# Patient Record
Sex: Female | Born: 1995 | Race: White | Hispanic: No
Health system: Western US, Academic
[De-identification: ages and names within clinical notes are randomized; demographics above are authoritative.]

## PROBLEM LIST (undated history)

## (undated) DIAGNOSIS — Z87820 Personal history of traumatic brain injury: Secondary | ICD-10-CM

## (undated) DIAGNOSIS — E109 Type 1 diabetes mellitus without complications: Secondary | ICD-10-CM

## (undated) DIAGNOSIS — J3489 Other specified disorders of nose and nasal sinuses: Secondary | ICD-10-CM

## (undated) DIAGNOSIS — Z9641 Presence of insulin pump (external) (internal): Secondary | ICD-10-CM

## (undated) DIAGNOSIS — T8859XA Other complications of anesthesia, initial encounter: Secondary | ICD-10-CM

## (undated) DIAGNOSIS — T4145XA Adverse effect of unspecified anesthetic, initial encounter: Secondary | ICD-10-CM

## (undated) HISTORY — DX: Type 1 diabetes mellitus without complications: E10.9

## (undated) HISTORY — PX: WISDOM TOOTH EXTRACTION: SHX21

---
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
## 2016-01-12 DIAGNOSIS — E1065 Type 1 diabetes mellitus with hyperglycemia: Secondary | ICD-10-CM | POA: Diagnosis not present

## 2016-02-03 DIAGNOSIS — Z23 Encounter for immunization: Secondary | ICD-10-CM | POA: Diagnosis not present

## 2016-02-16 DIAGNOSIS — E1065 Type 1 diabetes mellitus with hyperglycemia: Secondary | ICD-10-CM | POA: Diagnosis not present

## 2016-03-01 DIAGNOSIS — E1065 Type 1 diabetes mellitus with hyperglycemia: Secondary | ICD-10-CM | POA: Diagnosis not present

## 2016-03-08 DIAGNOSIS — R220 Localized swelling, mass and lump, head: Secondary | ICD-10-CM | POA: Diagnosis not present

## 2016-03-17 DIAGNOSIS — J3489 Other specified disorders of nose and nasal sinuses: Secondary | ICD-10-CM

## 2016-03-17 HISTORY — DX: Other specified disorders of nose and nasal sinuses: J34.89

## 2016-03-22 NOTE — H&P (Signed)
Subjective:     Patient ID: Danielle Chapman is a 20 y.o. female.  HPI  Returns to office for evaluation mass left nostril present for 4-5 years. Reports had cold during time of development. Notes increased in size when first appeared, now stable. No history infection, drainage. No prior interventions with exception cream given by dermatology. Last seen here 10.2016 by Dr. Marla Roe that recommended surgery referral for evaluation pilonidal- patient and mother report was seen by General Surgeon who recommended no surgical intervention, antibiotics as needed.    PMH significant for DM 1, managed by Dr. Domenica Fail Endocrinology at [facility]. Has insulin pump. Reports last HbA1c 11.0, but this was following prolonged back packing trip and plan was to leave BS at higher range. Notes had wisdom teeth out in office under sedation, woke up very upset which she does not remember. Also mother reports surgeon allowed her to eat toast in am, given her BS.   Review of Systems As HPI, remainder 12 point review negative.    Objective:   Physical Exam  Constitutional: She is oriented to person, place, and time.  Cardiovascular: Normal rate, regular rhythm and normal heart sounds.   Pulmonary/Chest: Effort normal and breath sounds normal.  Neurological: She is alert and oriented to person, place, and time.  Skin:  Fitzpatrick 1  HEENT: left nostril sill 5 mm mass, no punctum visisble     Assessment:     Mass left nostril DM 1    Plan:     Soft tissue mass left nostril, has been counseled cyst in past. Reviewed excisional biopsy, this would produce scar longer than lesion itself and some part of scar may be visible/present over upper lip skin. Discussed in office under local vs under sedation. Reports aversion to needles despite DM1; counseled the local will burn with injection and area is a sensitive area. She would prefer under sedation; counseled I would be ok with IV sedation but ultimately this is the  decision of anesthesiologist. Mother expressed concern regarding her DM and NPO status; one of reasons they have not pursued excision in past.   Reviewed scar maturation over months, expected edema and post procedure limitations.   Irene Limbo, MD [facility]astic [facility] Reconstructive Surgery [phone], pin [phone]

## 2016-03-30 ENCOUNTER — Encounter (HOSPITAL_BASED_OUTPATIENT_CLINIC_OR_DEPARTMENT_OTHER): Payer: Self-pay | Admitting: *Deleted

## 2016-03-30 NOTE — Pre-Procedure Instructions (Signed)
To come for BMET, EKG and to pick up 8 oz. water for ERAS 

## 2016-03-31 ENCOUNTER — Encounter (HOSPITAL_BASED_OUTPATIENT_CLINIC_OR_DEPARTMENT_OTHER)
Admission: RE | Admit: 2016-03-31 | Discharge: 2016-03-31 | Disposition: A | Discharge status: Home / Self Care | Payer: BLUE CROSS/BLUE SHIELD | Source: Ambulatory Visit | Attending: Plastic Surgery | Admitting: Plastic Surgery

## 2016-03-31 DIAGNOSIS — D1809 Hemangioma of other sites: Secondary | ICD-10-CM | POA: Diagnosis not present

## 2016-03-31 DIAGNOSIS — E109 Type 1 diabetes mellitus without complications: Secondary | ICD-10-CM | POA: Diagnosis not present

## 2016-03-31 DIAGNOSIS — R220 Localized swelling, mass and lump, head: Secondary | ICD-10-CM | POA: Diagnosis not present

## 2016-03-31 LAB — BASIC METABOLIC PANEL
Anion gap: 8 (ref 5–15)
BUN: 17 mg/dL (ref 6–20)
CALCIUM: 9 mg/dL (ref 8.9–10.3)
CO2: 25 mmol/L (ref 22–32)
CREATININE: 0.64 mg/dL (ref 0.44–1.00)
Chloride: 102 mmol/L (ref 101–111)
GFR calc Af Amer: >60 mL/min (ref >60)
Glucose, Bld: 354 mg/dL — ABNORMAL HIGH (ref 65–99)
Potassium: 4.6 mmol/L (ref 3.5–5.1)
SODIUM: 135 mmol/L (ref 135–145)

## 2016-03-31 NOTE — Progress Notes (Signed)
Left message for ashely's mother results of blood surgar of 354.instructed to return call.  Spoke to Dr Marin Comment no further treatment needed at this time

## 2016-03-31 NOTE — Pre-Procedure Instructions (Signed)
Pt. called back, states her sugar usually does not run that high, but she gets very anxious with needle sticks.  States has adjusted Insulin pump to cover her elevated blood sugar.

## 2016-03-31 NOTE — Progress Notes (Signed)
Pt given 8 oz bottle of water to drink by 4 am morning of surgery teach back , written and verbal ,  Pt voiced understanding

## 2016-04-02 NOTE — Anesthesia Preprocedure Evaluation (Addendum)
Anesthesia Evaluation  Patient identified by MRN, date of birth, ID band Patient awake    Reviewed: Allergy & Precautions, NPO status , Patient's Chart, lab work & pertinent test results  Airway Mallampati: II  TM Distance: >3 FB Neck ROM: Full    Dental  (+) Teeth Intact, Dental Advisory Given   Pulmonary neg pulmonary ROS,    Pulmonary exam normal breath sounds clear to auscultation       Cardiovascular negative cardio ROS Normal cardiovascular exam Rhythm:Regular Rate:Normal     Neuro/Psych negative neurological ROS     GI/Hepatic negative GI ROS, Neg liver ROS,   Endo/Other  diabetes (Insulin pump), Type 1, Insulin Dependent  Renal/GU negative Renal ROS     Musculoskeletal negative musculoskeletal ROS (+)   Abdominal   Peds  Hematology negative hematology ROS (+)   Anesthesia Other Findings Day of surgery medications reviewed with the patient.  Reproductive/Obstetrics negative OB ROS                            Anesthesia Physical Anesthesia Plan  ASA: II  Anesthesia Plan: MAC   Post-op Pain Management:    Induction: Intravenous  Airway Management Planned: Nasal Cannula  Additional Equipment:   Intra-op Plan:   Post-operative Plan:   Informed Consent: I have reviewed the patients History and Physical, chart, labs and discussed the procedure including the risks, benefits and alternatives for the proposed anesthesia with the patient or authorized representative who has indicated his/her understanding and acceptance.   Dental advisory given  Plan Discussed with: CRNA and Anesthesiologist  Anesthesia Plan Comments: (Discussed risks/benefits/alternatives to MAC sedation including need for ventilatory support, hypotension, need for conversion to general anesthesia.  All patient questions answered.  Patient/guardian wishes to proceed.)       Anesthesia Quick Evaluation

## 2016-04-03 ENCOUNTER — Encounter (HOSPITAL_BASED_OUTPATIENT_CLINIC_OR_DEPARTMENT_OTHER): Payer: Self-pay | Admitting: Anesthesiology

## 2016-04-03 ENCOUNTER — Ambulatory Visit (HOSPITAL_BASED_OUTPATIENT_CLINIC_OR_DEPARTMENT_OTHER): Payer: BLUE CROSS/BLUE SHIELD | Admitting: Anesthesiology

## 2016-04-03 ENCOUNTER — Ambulatory Visit (HOSPITAL_BASED_OUTPATIENT_CLINIC_OR_DEPARTMENT_OTHER)
Admission: RE | Admit: 2016-04-03 | Discharge: 2016-04-03 | Disposition: A | Discharge status: Home / Self Care | Payer: BLUE CROSS/BLUE SHIELD | Source: Ambulatory Visit | Attending: Plastic Surgery | Admitting: Plastic Surgery

## 2016-04-03 ENCOUNTER — Encounter (HOSPITAL_BASED_OUTPATIENT_CLINIC_OR_DEPARTMENT_OTHER)
Admission: RE | Disposition: A | Discharge status: Home / Self Care | Payer: Self-pay | Source: Ambulatory Visit | Attending: Plastic Surgery

## 2016-04-03 DIAGNOSIS — E109 Type 1 diabetes mellitus without complications: Secondary | ICD-10-CM | POA: Diagnosis not present

## 2016-04-03 DIAGNOSIS — D1809 Hemangioma of other sites: Secondary | ICD-10-CM | POA: Insufficient documentation

## 2016-04-03 DIAGNOSIS — R220 Localized swelling, mass and lump, head: Secondary | ICD-10-CM | POA: Diagnosis not present

## 2016-04-03 DIAGNOSIS — D1801 Hemangioma of skin and subcutaneous tissue: Secondary | ICD-10-CM | POA: Diagnosis not present

## 2016-04-03 DIAGNOSIS — E1010 Type 1 diabetes mellitus with ketoacidosis without coma: Secondary | ICD-10-CM | POA: Diagnosis not present

## 2016-04-03 DIAGNOSIS — J3489 Other specified disorders of nose and nasal sinuses: Secondary | ICD-10-CM | POA: Diagnosis not present

## 2016-04-03 HISTORY — DX: Personal history of traumatic brain injury: Z87.820

## 2016-04-03 HISTORY — DX: Other complications of anesthesia, initial encounter: T88.59XA

## 2016-04-03 HISTORY — DX: Adverse effect of unspecified anesthetic, initial encounter: T41.45XA

## 2016-04-03 HISTORY — DX: Presence of insulin pump (external) (internal): Z96.41

## 2016-04-03 HISTORY — PX: LESION EXCISION WITH COMPLEX REPAIR: SHX6700

## 2016-04-03 HISTORY — DX: Other specified disorders of nose and nasal sinuses: J34.89

## 2016-04-03 LAB — GLUCOSE, CAPILLARY
GLUCOSE-CAPILLARY: 223 mg/dL — AB (ref 65–99)
GLUCOSE-CAPILLARY: 239 mg/dL — AB (ref 65–99)

## 2016-04-03 SURGERY — LESION EXCISION WITH COMPLEX REPAIR
Anesthesia: Monitor Anesthesia Care | Site: Nose

## 2016-04-03 MED ORDER — BACITRACIN 500 UNIT/GM EX OINT
TOPICAL_OINTMENT | CUTANEOUS | Status: DC | PRN
  Administered 2016-04-03: 1 via TOPICAL

## 2016-04-03 MED ORDER — MIDAZOLAM HCL 2 MG/2ML IJ SOLN
1.0000 mg | INTRAMUSCULAR | Status: DC | PRN
  Administered 2016-04-03: 2 mg via INTRAVENOUS

## 2016-04-03 MED ORDER — BUPIVACAINE-EPINEPHRINE 0.25% -1:200000 IJ SOLN
INTRAMUSCULAR | Status: DC | PRN
  Administered 2016-04-03: 2 mL

## 2016-04-03 MED ORDER — FENTANYL CITRATE (PF) 100 MCG/2ML IJ SOLN
INTRAMUSCULAR | Status: AC
  Filled 2016-04-03: qty 2

## 2016-04-03 MED ORDER — MIDAZOLAM HCL 2 MG/2ML IJ SOLN
INTRAMUSCULAR | Status: AC
  Filled 2016-04-03: qty 2

## 2016-04-03 MED ORDER — FENTANYL CITRATE (PF) 100 MCG/2ML IJ SOLN
50.0000 ug | INTRAMUSCULAR | Status: DC | PRN
  Administered 2016-04-03: 100 ug via INTRAVENOUS

## 2016-04-03 MED ORDER — PROPOFOL 10 MG/ML IV BOLUS
INTRAVENOUS | Status: DC | PRN
  Administered 2016-04-03: 50 mg via INTRAVENOUS

## 2016-04-03 MED ORDER — PROMETHAZINE HCL 25 MG/ML IJ SOLN: 6.2500 mg | INTRAMUSCULAR | Status: DC | PRN

## 2016-04-03 MED ORDER — SCOPOLAMINE 1 MG/3DAYS TD PT72
1.0000 | MEDICATED_PATCH | Freq: Once | TRANSDERMAL | Status: DC | PRN
Start: 2016-04-03 — End: 2016-04-03

## 2016-04-03 MED ORDER — FENTANYL CITRATE (PF) 100 MCG/2ML IJ SOLN: 25.0000 ug | INTRAMUSCULAR | Status: DC | PRN

## 2016-04-03 MED ORDER — CLINDAMYCIN PHOSPHATE 600 MG/50ML IV SOLN
INTRAVENOUS | Status: AC
  Filled 2016-04-03: qty 50

## 2016-04-03 MED ORDER — LACTATED RINGERS IV SOLN
INTRAVENOUS | Status: DC
  Administered 2016-04-03: 07:00:00 via INTRAVENOUS

## 2016-04-03 MED ORDER — BACITRACIN ZINC 500 UNIT/GM EX OINT
TOPICAL_OINTMENT | CUTANEOUS | Status: AC
  Filled 2016-04-03: qty 0.9

## 2016-04-03 MED ORDER — BUPIVACAINE-EPINEPHRINE (PF) 0.25% -1:200000 IJ SOLN
INTRAMUSCULAR | Status: AC
  Filled 2016-04-03: qty 30

## 2016-04-03 MED ORDER — PROPOFOL 500 MG/50ML IV EMUL
INTRAVENOUS | Status: AC
  Filled 2016-04-03: qty 50

## 2016-04-03 MED ORDER — CLINDAMYCIN PHOSPHATE 600 MG/50ML IV SOLN
600.0000 mg | Freq: Once | INTRAVENOUS | Status: AC
Start: 2016-04-03 — End: 2016-04-03
  Administered 2016-04-03: 600 mg via INTRAVENOUS

## 2016-04-03 MED ORDER — LIDOCAINE HCL (PF) 1 % IJ SOLN
INTRAMUSCULAR | Status: AC
  Filled 2016-04-03: qty 30

## 2016-04-03 MED ORDER — BACITRACIN-NEOMYCIN-POLYMYXIN 400-5-5000 EX OINT
TOPICAL_OINTMENT | CUTANEOUS | Status: AC
  Filled 2016-04-03: qty 1

## 2016-04-03 SURGICAL SUPPLY — 49 items
BALL CTTN LRG ABS STRL LF (GAUZE/BANDAGES/DRESSINGS)
BLADE CLIPPER SURG (BLADE) IMPLANT
BLADE SURG 15 STRL LF DISP TIS (BLADE) ×1 IMPLANT
BLADE SURG 15 STRL SS (BLADE) ×3
CLOSURE WOUND 1/2 X4 (GAUZE/BANDAGES/DRESSINGS)
CLOSURE WOUND 1/4X4 (GAUZE/BANDAGES/DRESSINGS)
COTTONBALL LRG STERILE PKG (GAUZE/BANDAGES/DRESSINGS) IMPLANT
COVER BACK TABLE 60X90IN (DRAPES) ×3 IMPLANT
COVER MAYO STAND STRL (DRAPES) ×3 IMPLANT
DECANTER SPIKE VIAL GLASS SM (MISCELLANEOUS) IMPLANT
DRAPE U-SHAPE 76X120 STRL (DRAPES) ×3 IMPLANT
ELECT COATED BLADE 2.86 ST (ELECTRODE) IMPLANT
ELECT NDL BLADE 2-5/6 (NEEDLE) ×1 IMPLANT
ELECT NEEDLE BLADE 2-5/6 (NEEDLE) ×3 IMPLANT
ELECT REM PT RETURN 9FT ADLT (ELECTROSURGICAL) ×3
ELECTRODE REM PT RTRN 9FT ADLT (ELECTROSURGICAL) ×1 IMPLANT
GAUZE XEROFORM 1X8 LF (GAUZE/BANDAGES/DRESSINGS) IMPLANT
GAUZE XEROFORM 5X9 LF (GAUZE/BANDAGES/DRESSINGS) IMPLANT
GLOVE BIO SURGEON STRL SZ 6 (GLOVE) ×3 IMPLANT
GLOVE BIO SURGEON STRL SZ 6.5 (GLOVE) IMPLANT
GLOVE BIO SURGEONS STRL SZ 6.5 (GLOVE)
GLOVE BIOGEL PI IND STRL 7.0 (GLOVE) IMPLANT
GLOVE BIOGEL PI INDICATOR 7.0 (GLOVE) ×2
GLOVE ECLIPSE 6.5 STRL STRAW (GLOVE) ×2 IMPLANT
GOWN STRL REUS W/ TWL LRG LVL3 (GOWN DISPOSABLE) ×2 IMPLANT
GOWN STRL REUS W/TWL LRG LVL3 (GOWN DISPOSABLE) ×6
LIQUID BAND (GAUZE/BANDAGES/DRESSINGS) IMPLANT
NDL PRECISIONGLIDE 27X1.5 (NEEDLE) ×1 IMPLANT
NEEDLE PRECISIONGLIDE 27X1.5 (NEEDLE) ×3 IMPLANT
PACK BASIN DAY SURGERY FS (CUSTOM PROCEDURE TRAY) ×3 IMPLANT
PENCIL BUTTON HOLSTER BLD 10FT (ELECTRODE) ×3 IMPLANT
SHEET MEDIUM DRAPE 40X70 STRL (DRAPES) IMPLANT
SLEEVE SCD COMPRESS KNEE MED (MISCELLANEOUS) IMPLANT
SPONGE GAUZE 2X2 8PLY STER LF (GAUZE/BANDAGES/DRESSINGS)
SPONGE GAUZE 2X2 8PLY STRL LF (GAUZE/BANDAGES/DRESSINGS) IMPLANT
STRIP CLOSURE SKIN 1/2X4 (GAUZE/BANDAGES/DRESSINGS) IMPLANT
STRIP CLOSURE SKIN 1/4X4 (GAUZE/BANDAGES/DRESSINGS) IMPLANT
SUT MNCRL AB 3-0 PS2 18 (SUTURE) ×3 IMPLANT
SUT MNCRL AB 4-0 PS2 18 (SUTURE) ×1 IMPLANT
SUT PLAIN 5 0 P 3 18 (SUTURE) ×2 IMPLANT
SUT PROLENE 5 0 P 3 (SUTURE) IMPLANT
SUT PROLENE 5 0 PS 2 (SUTURE) IMPLANT
SUT PROLENE 6 0 P 1 18 (SUTURE) IMPLANT
SUT VIC AB 3-0 SH 27 (SUTURE)
SUT VIC AB 3-0 SH 27X BRD (SUTURE) ×1 IMPLANT
SYR BULB 3OZ (MISCELLANEOUS) IMPLANT
SYR CONTROL 10ML LL (SYRINGE) ×3 IMPLANT
TOWEL OR 17X24 6PK STRL BLUE (TOWEL DISPOSABLE) ×5 IMPLANT
TRAY DSU PREP LF (CUSTOM PROCEDURE TRAY) ×3 IMPLANT

## 2016-04-03 NOTE — Discharge Instructions (Signed)

## 2016-04-03 NOTE — Transfer of Care (Signed)
Immediate Anesthesia Transfer of Care Note  Patient: Danielle Chapman  Procedure(s) Performed: Procedure(s): LESION EXCISION nose WITH layer close nose 1cm (N/A)  Patient Location: PACU  Anesthesia Type:MAC  Level of Consciousness: awake, alert  and oriented  Airway & Oxygen Therapy: Patient Spontanous Breathing  Post-op Assessment: Report given to RN and Post -op Vital signs reviewed and stable  Post vital signs: Reviewed and stable  Last Vitals:  Vitals:   04/03/16 0647  BP: 104/72  Pulse: 84  Resp: 18  Temp: 36.7 C    Last Pain:  Vitals:   04/03/16 0647  TempSrc: Oral      Patients Stated Pain Goal: 0 ([id number])  Complications: No apparent anesthesia complications

## 2016-04-03 NOTE — Interval H&P Note (Signed)
History and Physical Interval Note:  04/03/2016 7:00 AM  Danielle Chapman  has presented today for surgery, with the diagnosis of lesion nose  The various methods of treatment have been discussed with the patient and family. After consideration of risks, benefits and other options for treatment, the patient has consented to  Procedure(s): LESION EXCISION nose WITH layer close nose 1cm (N/A) as a surgical intervention .  The patient's history has been reviewed, patient examined, no change in status, stable for surgery.  I have reviewed the patient's chart and labs.  Questions were answered to the patient's satisfaction.     [name]

## 2016-04-03 NOTE — Anesthesia Postprocedure Evaluation (Signed)
Anesthesia Post Note  Patient: Danielle Chapman  Procedure(s) Performed: Procedure(s) (LRB): LESION EXCISION nose WITH layer close nose 1cm (N/A)  Patient location during evaluation: PACU Anesthesia Type: MAC Level of consciousness: awake and alert Pain management: pain level controlled Vital Signs Assessment: post-procedure vital signs reviewed and stable Respiratory status: spontaneous breathing, nonlabored ventilation, respiratory function stable and patient connected to nasal cannula oxygen Cardiovascular status: stable and blood pressure returned to baseline Anesthetic complications: no    Last Vitals:  Vitals:   04/03/16 0647 04/03/16 0745  BP: 104/72   Pulse: 84   Resp: 18 20  Temp: 36.7 C 36.6 C    Last Pain:  Vitals:   04/03/16 0647  TempSrc: Oral                 Catalina Gravel

## 2016-04-03 NOTE — Op Note (Signed)
Operative Note   DATE OF OPERATION: 12.18.17  LOCATION: [facility]-outpatient  SURGICAL DIVISION: Plastic Surgery  PREOPERATIVE DIAGNOSES:  1. Left nostril mass  POSTOPERATIVE DIAGNOSES:  same  PROCEDURE:  1. Excision benign mass left nostril 7 mm 2. Layered closure left nostril sill 1 cm  SURGEON: Irene Limbo MD MBA  ASSISTANT: none  ANESTHESIA:  MAC.   EBL: minimal  COMPLICATIONS: None immediate.   INDICATIONS FOR PROCEDURE:  The patient, Danielle Chapman, is a 20 y.o. female born on July 09, 1995, is here for excision soft tissue mass left nostril   FINDINGS: Clinically mass consistent with cyst.  DESCRIPTION OF PROCEDURE:  The patient's operative site was confirmed with the patient in the preoperative area. The patient was taken to the operating room. IV antibiotics were given. Following adequate sedation, the patient's operative site was prepped and draped in a sterile fashion. A time out was performed and all information was confirmed to be correct. Local anesthetic infiltrated to perform left infraorbital nerve block and surrounding mass. Transverse ellipse skin including mass and margins diameter 7 mm excised. Layered closure completed with 5-0 monocryl in dermis and running 5-0 plain gut for skin closure, length 1 cm. Antibiotic ointment applied.   The patient was allowed to wake from anesthesia, extubated and taken to the recovery room in satisfactory condition.   SPECIMENS: left nostril mass  DRAINS: none  Irene Limbo, MD [facility] Plastic & Reconstructive Surgery [phone], pin [phone]

## 2016-04-04 ENCOUNTER — Encounter (HOSPITAL_BASED_OUTPATIENT_CLINIC_OR_DEPARTMENT_OTHER): Payer: Self-pay | Admitting: Plastic Surgery

## 2016-04-05 DIAGNOSIS — Z23 Encounter for immunization: Secondary | ICD-10-CM | POA: Diagnosis not present

## 2016-04-12 DIAGNOSIS — E1065 Type 1 diabetes mellitus with hyperglycemia: Secondary | ICD-10-CM | POA: Diagnosis not present

## 2016-04-12 DIAGNOSIS — J019 Acute sinusitis, unspecified: Secondary | ICD-10-CM | POA: Diagnosis not present

## 2016-04-12 DIAGNOSIS — B9689 Other specified bacterial agents as the cause of diseases classified elsewhere: Secondary | ICD-10-CM | POA: Diagnosis not present

## 2016-04-12 DIAGNOSIS — J40 Bronchitis, not specified as acute or chronic: Secondary | ICD-10-CM | POA: Diagnosis not present

## 2016-05-01 DIAGNOSIS — J029 Acute pharyngitis, unspecified: Secondary | ICD-10-CM | POA: Diagnosis not present

## 2016-05-29 DIAGNOSIS — H109 Unspecified conjunctivitis: Secondary | ICD-10-CM | POA: Diagnosis not present

## 2016-05-29 DIAGNOSIS — J00 Acute nasopharyngitis [common cold]: Secondary | ICD-10-CM | POA: Diagnosis not present

## 2016-06-07 DIAGNOSIS — E1065 Type 1 diabetes mellitus with hyperglycemia: Secondary | ICD-10-CM | POA: Diagnosis not present

## 2016-07-12 DIAGNOSIS — E109 Type 1 diabetes mellitus without complications: Secondary | ICD-10-CM | POA: Diagnosis not present

## 2016-07-12 DIAGNOSIS — H10413 Chronic giant papillary conjunctivitis, bilateral: Secondary | ICD-10-CM | POA: Diagnosis not present

## 2016-07-14 DIAGNOSIS — R1110 Vomiting, unspecified: Secondary | ICD-10-CM | POA: Diagnosis not present

## 2016-07-14 DIAGNOSIS — Y909 Presence of alcohol in blood, level not specified: Secondary | ICD-10-CM | POA: Diagnosis not present

## 2016-07-14 DIAGNOSIS — F10120 Alcohol abuse with intoxication, uncomplicated: Secondary | ICD-10-CM | POA: Diagnosis not present

## 2016-07-19 DIAGNOSIS — E1065 Type 1 diabetes mellitus with hyperglycemia: Secondary | ICD-10-CM | POA: Diagnosis not present

## 2016-07-26 DIAGNOSIS — H10413 Chronic giant papillary conjunctivitis, bilateral: Secondary | ICD-10-CM | POA: Diagnosis not present

## 2016-07-26 DIAGNOSIS — H04123 Dry eye syndrome of bilateral lacrimal glands: Secondary | ICD-10-CM | POA: Diagnosis not present

## 2016-07-26 DIAGNOSIS — E109 Type 1 diabetes mellitus without complications: Secondary | ICD-10-CM | POA: Diagnosis not present

## 2016-08-16 DIAGNOSIS — E109 Type 1 diabetes mellitus without complications: Secondary | ICD-10-CM | POA: Diagnosis not present

## 2016-08-16 DIAGNOSIS — H04123 Dry eye syndrome of bilateral lacrimal glands: Secondary | ICD-10-CM | POA: Diagnosis not present

## 2016-08-16 DIAGNOSIS — H10413 Chronic giant papillary conjunctivitis, bilateral: Secondary | ICD-10-CM | POA: Diagnosis not present

## 2016-08-16 DIAGNOSIS — E1065 Type 1 diabetes mellitus with hyperglycemia: Secondary | ICD-10-CM | POA: Diagnosis not present

## 2016-08-16 DIAGNOSIS — H1013 Acute atopic conjunctivitis, bilateral: Secondary | ICD-10-CM | POA: Diagnosis not present

## 2016-08-18 DIAGNOSIS — E1065 Type 1 diabetes mellitus with hyperglycemia: Secondary | ICD-10-CM | POA: Diagnosis not present

## 2016-11-20 DIAGNOSIS — Z713 Dietary counseling and surveillance: Secondary | ICD-10-CM | POA: Diagnosis not present

## 2016-11-20 DIAGNOSIS — Z68.41 Body mass index (BMI) pediatric, 5th percentile to less than 85th percentile for age: Secondary | ICD-10-CM | POA: Diagnosis not present

## 2016-11-20 DIAGNOSIS — Z0000 Encounter for general adult medical examination without abnormal findings: Secondary | ICD-10-CM | POA: Diagnosis not present

## 2016-11-20 DIAGNOSIS — Z7182 Exercise counseling: Secondary | ICD-10-CM | POA: Diagnosis not present

## 2016-11-29 DIAGNOSIS — Z794 Long term (current) use of insulin: Secondary | ICD-10-CM | POA: Diagnosis not present

## 2016-11-29 DIAGNOSIS — E1065 Type 1 diabetes mellitus with hyperglycemia: Secondary | ICD-10-CM | POA: Diagnosis not present

## 2016-11-29 DIAGNOSIS — E162 Hypoglycemia, unspecified: Secondary | ICD-10-CM | POA: Diagnosis not present

## 2016-12-28 DIAGNOSIS — E1065 Type 1 diabetes mellitus with hyperglycemia: Secondary | ICD-10-CM | POA: Diagnosis not present

## 2017-03-12 DIAGNOSIS — Z794 Long term (current) use of insulin: Secondary | ICD-10-CM | POA: Diagnosis not present

## 2017-03-12 DIAGNOSIS — Z23 Encounter for immunization: Secondary | ICD-10-CM | POA: Diagnosis not present

## 2017-03-12 DIAGNOSIS — Z9641 Presence of insulin pump (external) (internal): Secondary | ICD-10-CM | POA: Diagnosis not present

## 2017-03-12 DIAGNOSIS — E1065 Type 1 diabetes mellitus with hyperglycemia: Secondary | ICD-10-CM | POA: Diagnosis not present

## 2017-03-22 DIAGNOSIS — R5383 Other fatigue: Secondary | ICD-10-CM | POA: Diagnosis not present

## 2017-03-28 DIAGNOSIS — E1065 Type 1 diabetes mellitus with hyperglycemia: Secondary | ICD-10-CM | POA: Diagnosis not present

## 2017-03-29 DIAGNOSIS — E1065 Type 1 diabetes mellitus with hyperglycemia: Secondary | ICD-10-CM | POA: Diagnosis not present

## 2017-05-11 DIAGNOSIS — Z113 Encounter for screening for infections with a predominantly sexual mode of transmission: Secondary | ICD-10-CM | POA: Diagnosis not present

## 2017-06-11 DIAGNOSIS — E1065 Type 1 diabetes mellitus with hyperglycemia: Secondary | ICD-10-CM | POA: Diagnosis not present

## 2017-06-11 DIAGNOSIS — Z23 Encounter for immunization: Secondary | ICD-10-CM | POA: Diagnosis not present

## 2017-08-08 DIAGNOSIS — H04123 Dry eye syndrome of bilateral lacrimal glands: Secondary | ICD-10-CM | POA: Diagnosis not present

## 2017-08-08 DIAGNOSIS — E109 Type 1 diabetes mellitus without complications: Secondary | ICD-10-CM | POA: Diagnosis not present

## 2017-11-26 DIAGNOSIS — E1065 Type 1 diabetes mellitus with hyperglycemia: Secondary | ICD-10-CM | POA: Diagnosis not present

## 2018-03-07 DIAGNOSIS — E1065 Type 1 diabetes mellitus with hyperglycemia: Secondary | ICD-10-CM | POA: Diagnosis not present

## 2018-03-26 DIAGNOSIS — E1065 Type 1 diabetes mellitus with hyperglycemia: Secondary | ICD-10-CM | POA: Diagnosis not present

## 2018-07-04 ENCOUNTER — Telehealth (HOSPITAL_COMMUNITY): Payer: Self-pay | Admitting: Pediatrics

## 2018-07-04 ENCOUNTER — Telehealth: Payer: Self-pay | Admitting: Emergency Medicine

## 2018-07-04 DIAGNOSIS — R509 Fever, unspecified: Secondary | ICD-10-CM

## 2018-07-04 NOTE — Telephone Encounter (Signed)
PRN nurse called. Patient returned from [facility] 07/02/2018 (2 days ago). Patient has now developed fever and cough.  Recommend COVID19 testing based on exposure history and symptoms.

## 2018-07-09 LAB — NOVEL CORONAVIRUS, NAA: SARS-CoV-2, NAA: NOT DETECTED

## 2018-08-26 DIAGNOSIS — Z03818 Encounter for observation for suspected exposure to other biological agents ruled out: Secondary | ICD-10-CM | POA: Diagnosis not present

## 2018-09-27 DIAGNOSIS — H5713 Ocular pain, bilateral: Secondary | ICD-10-CM | POA: Diagnosis not present

## 2018-09-27 DIAGNOSIS — E109 Type 1 diabetes mellitus without complications: Secondary | ICD-10-CM | POA: Diagnosis not present

## 2018-10-13 DIAGNOSIS — Z20828 Contact with and (suspected) exposure to other viral communicable diseases: Secondary | ICD-10-CM | POA: Diagnosis not present

## 2018-12-24 DIAGNOSIS — Z20828 Contact with and (suspected) exposure to other viral communicable diseases: Secondary | ICD-10-CM | POA: Diagnosis not present

## 2019-04-25 DIAGNOSIS — Z1339 Encounter for screening examination for other mental health and behavioral disorders: Secondary | ICD-10-CM | POA: Diagnosis not present

## 2019-04-25 DIAGNOSIS — E1065 Type 1 diabetes mellitus with hyperglycemia: Secondary | ICD-10-CM | POA: Diagnosis not present

## 2019-04-25 DIAGNOSIS — Z1389 Encounter for screening for other disorder: Secondary | ICD-10-CM | POA: Diagnosis not present

## 2019-04-25 DIAGNOSIS — Z20828 Contact with and (suspected) exposure to other viral communicable diseases: Secondary | ICD-10-CM | POA: Diagnosis not present

## 2019-04-30 DIAGNOSIS — R109 Unspecified abdominal pain: Secondary | ICD-10-CM | POA: Diagnosis not present

## 2019-04-30 DIAGNOSIS — R110 Nausea: Secondary | ICD-10-CM | POA: Diagnosis not present

## 2019-04-30 DIAGNOSIS — R300 Dysuria: Secondary | ICD-10-CM | POA: Diagnosis not present

## 2019-04-30 DIAGNOSIS — E878 Other disorders of electrolyte and fluid balance, not elsewhere classified: Secondary | ICD-10-CM | POA: Diagnosis not present

## 2019-04-30 DIAGNOSIS — R112 Nausea with vomiting, unspecified: Secondary | ICD-10-CM | POA: Diagnosis not present

## 2019-04-30 DIAGNOSIS — E1010 Type 1 diabetes mellitus with ketoacidosis without coma: Secondary | ICD-10-CM | POA: Diagnosis not present

## 2019-04-30 DIAGNOSIS — R1032 Left lower quadrant pain: Secondary | ICD-10-CM | POA: Diagnosis not present

## 2019-04-30 DIAGNOSIS — Z880 Allergy status to penicillin: Secondary | ICD-10-CM | POA: Diagnosis not present

## 2019-04-30 DIAGNOSIS — Z20822 Contact with and (suspected) exposure to covid-19: Secondary | ICD-10-CM | POA: Diagnosis not present

## 2019-04-30 DIAGNOSIS — Z885 Allergy status to narcotic agent status: Secondary | ICD-10-CM | POA: Diagnosis not present

## 2019-04-30 DIAGNOSIS — Z9641 Presence of insulin pump (external) (internal): Secondary | ICD-10-CM | POA: Diagnosis not present

## 2019-04-30 DIAGNOSIS — R0602 Shortness of breath: Secondary | ICD-10-CM | POA: Diagnosis not present

## 2019-05-01 DIAGNOSIS — E1010 Type 1 diabetes mellitus with ketoacidosis without coma: Secondary | ICD-10-CM | POA: Diagnosis not present

## 2019-05-01 DIAGNOSIS — Z9641 Presence of insulin pump (external) (internal): Secondary | ICD-10-CM | POA: Diagnosis not present

## 2019-05-19 DIAGNOSIS — Z880 Allergy status to penicillin: Secondary | ICD-10-CM | POA: Diagnosis not present

## 2019-05-19 DIAGNOSIS — Z885 Allergy status to narcotic agent status: Secondary | ICD-10-CM | POA: Diagnosis not present

## 2019-05-19 DIAGNOSIS — Z20822 Contact with and (suspected) exposure to covid-19: Secondary | ICD-10-CM | POA: Diagnosis not present

## 2019-05-19 DIAGNOSIS — R0602 Shortness of breath: Secondary | ICD-10-CM | POA: Diagnosis not present

## 2019-05-19 DIAGNOSIS — E1065 Type 1 diabetes mellitus with hyperglycemia: Secondary | ICD-10-CM | POA: Diagnosis not present

## 2019-05-19 DIAGNOSIS — E878 Other disorders of electrolyte and fluid balance, not elsewhere classified: Secondary | ICD-10-CM | POA: Diagnosis not present

## 2019-05-19 DIAGNOSIS — R350 Frequency of micturition: Secondary | ICD-10-CM | POA: Diagnosis not present

## 2019-05-19 DIAGNOSIS — E871 Hypo-osmolality and hyponatremia: Secondary | ICD-10-CM | POA: Diagnosis not present

## 2019-05-19 DIAGNOSIS — Z9641 Presence of insulin pump (external) (internal): Secondary | ICD-10-CM | POA: Diagnosis not present

## 2019-05-19 DIAGNOSIS — R1030 Lower abdominal pain, unspecified: Secondary | ICD-10-CM | POA: Diagnosis not present

## 2019-05-19 DIAGNOSIS — R110 Nausea: Secondary | ICD-10-CM | POA: Diagnosis not present

## 2019-05-26 DIAGNOSIS — E1065 Type 1 diabetes mellitus with hyperglycemia: Secondary | ICD-10-CM | POA: Diagnosis not present

## 2019-05-28 DIAGNOSIS — Z4681 Encounter for fitting and adjustment of insulin pump: Secondary | ICD-10-CM | POA: Diagnosis not present

## 2019-05-28 DIAGNOSIS — Z794 Long term (current) use of insulin: Secondary | ICD-10-CM | POA: Diagnosis not present

## 2019-05-28 DIAGNOSIS — E1065 Type 1 diabetes mellitus with hyperglycemia: Secondary | ICD-10-CM | POA: Diagnosis not present

## 2019-06-20 DIAGNOSIS — R05 Cough: Secondary | ICD-10-CM | POA: Diagnosis not present

## 2019-07-11 DIAGNOSIS — E1065 Type 1 diabetes mellitus with hyperglycemia: Secondary | ICD-10-CM | POA: Diagnosis not present

## 2019-07-30 DIAGNOSIS — Z794 Long term (current) use of insulin: Secondary | ICD-10-CM | POA: Diagnosis not present

## 2019-07-30 DIAGNOSIS — E1065 Type 1 diabetes mellitus with hyperglycemia: Secondary | ICD-10-CM | POA: Diagnosis not present

## 2019-07-30 DIAGNOSIS — U071 COVID-19: Secondary | ICD-10-CM | POA: Diagnosis not present

## 2019-07-30 DIAGNOSIS — Z4681 Encounter for fitting and adjustment of insulin pump: Secondary | ICD-10-CM | POA: Diagnosis not present

## 2019-08-14 DIAGNOSIS — J309 Allergic rhinitis, unspecified: Secondary | ICD-10-CM | POA: Diagnosis not present

## 2019-08-14 DIAGNOSIS — J019 Acute sinusitis, unspecified: Secondary | ICD-10-CM | POA: Diagnosis not present

## 2019-10-06 DIAGNOSIS — E109 Type 1 diabetes mellitus without complications: Secondary | ICD-10-CM | POA: Diagnosis not present

## 2019-11-13 DIAGNOSIS — E1065 Type 1 diabetes mellitus with hyperglycemia: Secondary | ICD-10-CM | POA: Diagnosis not present

## 2020-01-05 DIAGNOSIS — E109 Type 1 diabetes mellitus without complications: Secondary | ICD-10-CM | POA: Diagnosis not present

## 2020-01-05 DIAGNOSIS — R81 Glycosuria: Secondary | ICD-10-CM | POA: Diagnosis not present

## 2020-01-05 DIAGNOSIS — U071 COVID-19: Secondary | ICD-10-CM | POA: Diagnosis not present

## 2020-01-05 DIAGNOSIS — R300 Dysuria: Secondary | ICD-10-CM | POA: Diagnosis not present

## 2020-02-02 DIAGNOSIS — R300 Dysuria: Secondary | ICD-10-CM | POA: Diagnosis not present

## 2020-02-02 DIAGNOSIS — R309 Painful micturition, unspecified: Secondary | ICD-10-CM | POA: Diagnosis not present

## 2020-02-02 DIAGNOSIS — Z113 Encounter for screening for infections with a predominantly sexual mode of transmission: Secondary | ICD-10-CM | POA: Diagnosis not present

## 2020-02-02 DIAGNOSIS — Z118 Encounter for screening for other infectious and parasitic diseases: Secondary | ICD-10-CM | POA: Diagnosis not present

## 2020-02-02 DIAGNOSIS — Z01419 Encounter for gynecological examination (general) (routine) without abnormal findings: Secondary | ICD-10-CM | POA: Diagnosis not present

## 2020-02-02 DIAGNOSIS — Z23 Encounter for immunization: Secondary | ICD-10-CM | POA: Diagnosis not present

## 2020-04-07 DIAGNOSIS — E1065 Type 1 diabetes mellitus with hyperglycemia: Secondary | ICD-10-CM | POA: Diagnosis not present

## 2020-04-12 DIAGNOSIS — Z794 Long term (current) use of insulin: Secondary | ICD-10-CM | POA: Diagnosis not present

## 2020-04-26 DIAGNOSIS — Z30430 Encounter for insertion of intrauterine contraceptive device: Secondary | ICD-10-CM | POA: Diagnosis not present

## 2020-06-11 DIAGNOSIS — G43909 Migraine, unspecified, not intractable, without status migrainosus: Secondary | ICD-10-CM | POA: Diagnosis not present

## 2020-06-11 DIAGNOSIS — R519 Headache, unspecified: Secondary | ICD-10-CM | POA: Diagnosis not present

## 2020-06-17 DIAGNOSIS — J069 Acute upper respiratory infection, unspecified: Secondary | ICD-10-CM | POA: Diagnosis not present

## 2020-08-16 DIAGNOSIS — E1065 Type 1 diabetes mellitus with hyperglycemia: Secondary | ICD-10-CM | POA: Diagnosis not present

## 2020-08-16 DIAGNOSIS — E109 Type 1 diabetes mellitus without complications: Secondary | ICD-10-CM | POA: Diagnosis not present

## 2020-08-16 DIAGNOSIS — G43909 Migraine, unspecified, not intractable, without status migrainosus: Secondary | ICD-10-CM | POA: Diagnosis not present

## 2020-08-16 DIAGNOSIS — J988 Other specified respiratory disorders: Secondary | ICD-10-CM | POA: Diagnosis not present

## 2020-09-03 DIAGNOSIS — Z794 Long term (current) use of insulin: Secondary | ICD-10-CM | POA: Diagnosis not present

## 2020-09-03 DIAGNOSIS — F10129 Alcohol abuse with intoxication, unspecified: Secondary | ICD-10-CM | POA: Diagnosis not present

## 2020-09-03 DIAGNOSIS — R1110 Vomiting, unspecified: Secondary | ICD-10-CM | POA: Diagnosis not present

## 2020-09-03 DIAGNOSIS — E1065 Type 1 diabetes mellitus with hyperglycemia: Secondary | ICD-10-CM | POA: Diagnosis not present

## 2020-09-20 DIAGNOSIS — E109 Type 1 diabetes mellitus without complications: Secondary | ICD-10-CM | POA: Diagnosis not present

## 2020-09-23 DIAGNOSIS — E103293 Type 1 diabetes mellitus with mild nonproliferative diabetic retinopathy without macular edema, bilateral: Secondary | ICD-10-CM | POA: Diagnosis not present

## 2020-11-26 DIAGNOSIS — E1065 Type 1 diabetes mellitus with hyperglycemia: Secondary | ICD-10-CM | POA: Diagnosis not present

## 2020-12-17 DIAGNOSIS — E109 Type 1 diabetes mellitus without complications: Secondary | ICD-10-CM | POA: Diagnosis not present

## 2021-03-21 DIAGNOSIS — E1065 Type 1 diabetes mellitus with hyperglycemia: Secondary | ICD-10-CM | POA: Diagnosis not present

## 2021-03-21 DIAGNOSIS — Z9641 Presence of insulin pump (external) (internal): Secondary | ICD-10-CM | POA: Diagnosis not present

## 2021-03-21 DIAGNOSIS — E10319 Type 1 diabetes mellitus with unspecified diabetic retinopathy without macular edema: Secondary | ICD-10-CM | POA: Diagnosis not present

## 2021-03-21 DIAGNOSIS — E1021 Type 1 diabetes mellitus with diabetic nephropathy: Secondary | ICD-10-CM | POA: Diagnosis not present

## 2021-03-23 DIAGNOSIS — Z01419 Encounter for gynecological examination (general) (routine) without abnormal findings: Secondary | ICD-10-CM | POA: Diagnosis not present

## 2021-03-25 DIAGNOSIS — E109 Type 1 diabetes mellitus without complications: Secondary | ICD-10-CM | POA: Diagnosis not present

## 2021-03-25 DIAGNOSIS — E1065 Type 1 diabetes mellitus with hyperglycemia: Secondary | ICD-10-CM | POA: Diagnosis not present

## 2021-06-22 DIAGNOSIS — E109 Type 1 diabetes mellitus without complications: Secondary | ICD-10-CM | POA: Diagnosis not present

## 2021-08-19 DIAGNOSIS — E109 Type 1 diabetes mellitus without complications: Secondary | ICD-10-CM | POA: Diagnosis not present

## 2021-08-19 DIAGNOSIS — Z713 Dietary counseling and surveillance: Secondary | ICD-10-CM | POA: Diagnosis not present

## 2021-08-19 DIAGNOSIS — E10319 Type 1 diabetes mellitus with unspecified diabetic retinopathy without macular edema: Secondary | ICD-10-CM | POA: Diagnosis not present

## 2021-08-19 DIAGNOSIS — E1065 Type 1 diabetes mellitus with hyperglycemia: Secondary | ICD-10-CM | POA: Diagnosis not present

## 2021-08-19 DIAGNOSIS — Z9641 Presence of insulin pump (external) (internal): Secondary | ICD-10-CM | POA: Diagnosis not present

## 2021-08-19 DIAGNOSIS — Z6826 Body mass index (BMI) 26.0-26.9, adult: Secondary | ICD-10-CM | POA: Diagnosis not present

## 2021-09-22 DIAGNOSIS — E103393 Type 1 diabetes mellitus with moderate nonproliferative diabetic retinopathy without macular edema, bilateral: Secondary | ICD-10-CM | POA: Diagnosis not present

## 2021-11-02 DIAGNOSIS — Z30431 Encounter for routine checking of intrauterine contraceptive device: Secondary | ICD-10-CM | POA: Diagnosis not present

## 2021-11-02 DIAGNOSIS — N926 Irregular menstruation, unspecified: Secondary | ICD-10-CM | POA: Diagnosis not present

## 2021-11-04 DIAGNOSIS — N926 Irregular menstruation, unspecified: Secondary | ICD-10-CM | POA: Diagnosis not present

## 2021-11-04 DIAGNOSIS — O3680X0 Pregnancy with inconclusive fetal viability, not applicable or unspecified: Secondary | ICD-10-CM | POA: Diagnosis not present

## 2021-11-06 DIAGNOSIS — O3680X0 Pregnancy with inconclusive fetal viability, not applicable or unspecified: Secondary | ICD-10-CM | POA: Diagnosis not present

## 2021-11-07 DIAGNOSIS — O3680X0 Pregnancy with inconclusive fetal viability, not applicable or unspecified: Secondary | ICD-10-CM | POA: Diagnosis not present

## 2021-11-08 DIAGNOSIS — O009 Unspecified ectopic pregnancy without intrauterine pregnancy: Secondary | ICD-10-CM | POA: Diagnosis not present

## 2021-11-08 DIAGNOSIS — O3680X0 Pregnancy with inconclusive fetal viability, not applicable or unspecified: Secondary | ICD-10-CM | POA: Diagnosis not present

## 2021-11-10 DIAGNOSIS — O00109 Unspecified tubal pregnancy without intrauterine pregnancy: Secondary | ICD-10-CM | POA: Diagnosis not present

## 2021-11-10 DIAGNOSIS — E109 Type 1 diabetes mellitus without complications: Secondary | ICD-10-CM | POA: Diagnosis not present

## 2021-11-10 DIAGNOSIS — O3680X0 Pregnancy with inconclusive fetal viability, not applicable or unspecified: Secondary | ICD-10-CM | POA: Diagnosis not present

## 2021-11-10 DIAGNOSIS — Z01812 Encounter for preprocedural laboratory examination: Secondary | ICD-10-CM | POA: Diagnosis not present

## 2021-11-11 DIAGNOSIS — O3680X0 Pregnancy with inconclusive fetal viability, not applicable or unspecified: Secondary | ICD-10-CM | POA: Diagnosis not present

## 2021-11-11 DIAGNOSIS — Z30432 Encounter for removal of intrauterine contraceptive device: Secondary | ICD-10-CM | POA: Diagnosis not present

## 2021-11-11 DIAGNOSIS — Z885 Allergy status to narcotic agent status: Secondary | ICD-10-CM | POA: Diagnosis not present

## 2021-11-11 DIAGNOSIS — E109 Type 1 diabetes mellitus without complications: Secondary | ICD-10-CM | POA: Diagnosis not present

## 2021-11-11 DIAGNOSIS — Z880 Allergy status to penicillin: Secondary | ICD-10-CM | POA: Diagnosis not present

## 2021-11-11 DIAGNOSIS — Z794 Long term (current) use of insulin: Secondary | ICD-10-CM | POA: Diagnosis not present

## 2021-11-11 DIAGNOSIS — O0289 Other abnormal products of conception: Secondary | ICD-10-CM | POA: Diagnosis not present

## 2021-11-11 DIAGNOSIS — Z975 Presence of (intrauterine) contraceptive device: Secondary | ICD-10-CM | POA: Diagnosis not present

## 2021-11-15 DIAGNOSIS — O3680X0 Pregnancy with inconclusive fetal viability, not applicable or unspecified: Secondary | ICD-10-CM | POA: Diagnosis not present

## 2021-11-15 DIAGNOSIS — O009 Unspecified ectopic pregnancy without intrauterine pregnancy: Secondary | ICD-10-CM | POA: Diagnosis not present

## 2021-11-18 DIAGNOSIS — O3680X0 Pregnancy with inconclusive fetal viability, not applicable or unspecified: Secondary | ICD-10-CM | POA: Diagnosis not present

## 2021-11-18 DIAGNOSIS — E109 Type 1 diabetes mellitus without complications: Secondary | ICD-10-CM | POA: Diagnosis not present

## 2021-11-21 DIAGNOSIS — O3680X0 Pregnancy with inconclusive fetal viability, not applicable or unspecified: Secondary | ICD-10-CM | POA: Diagnosis not present

## 2021-11-28 DIAGNOSIS — Z6826 Body mass index (BMI) 26.0-26.9, adult: Secondary | ICD-10-CM | POA: Diagnosis not present

## 2021-11-28 DIAGNOSIS — E10319 Type 1 diabetes mellitus with unspecified diabetic retinopathy without macular edema: Secondary | ICD-10-CM | POA: Diagnosis not present

## 2021-11-28 DIAGNOSIS — E103313 Type 1 diabetes mellitus with moderate nonproliferative diabetic retinopathy with macular edema, bilateral: Secondary | ICD-10-CM | POA: Diagnosis not present

## 2021-11-28 DIAGNOSIS — E1065 Type 1 diabetes mellitus with hyperglycemia: Secondary | ICD-10-CM | POA: Diagnosis not present

## 2021-11-28 DIAGNOSIS — E10649 Type 1 diabetes mellitus with hypoglycemia without coma: Secondary | ICD-10-CM | POA: Diagnosis not present

## 2022-01-17 DIAGNOSIS — E109 Type 1 diabetes mellitus without complications: Secondary | ICD-10-CM | POA: Diagnosis not present

## 2022-01-31 DIAGNOSIS — J069 Acute upper respiratory infection, unspecified: Secondary | ICD-10-CM | POA: Diagnosis not present

## 2022-01-31 DIAGNOSIS — R509 Fever, unspecified: Secondary | ICD-10-CM | POA: Diagnosis not present

## 2022-02-17 DIAGNOSIS — E109 Type 1 diabetes mellitus without complications: Secondary | ICD-10-CM | POA: Diagnosis not present

## 2022-02-20 DIAGNOSIS — R300 Dysuria: Secondary | ICD-10-CM | POA: Diagnosis not present

## 2022-02-27 DIAGNOSIS — Z6827 Body mass index (BMI) 27.0-27.9, adult: Secondary | ICD-10-CM | POA: Diagnosis not present

## 2022-02-27 DIAGNOSIS — E113313 Type 2 diabetes mellitus with moderate nonproliferative diabetic retinopathy with macular edema, bilateral: Secondary | ICD-10-CM | POA: Diagnosis not present

## 2022-02-27 DIAGNOSIS — E11319 Type 2 diabetes mellitus with unspecified diabetic retinopathy without macular edema: Secondary | ICD-10-CM | POA: Diagnosis not present

## 2022-02-27 DIAGNOSIS — E113493 Type 2 diabetes mellitus with severe nonproliferative diabetic retinopathy without macular edema, bilateral: Secondary | ICD-10-CM | POA: Diagnosis not present

## 2022-02-27 DIAGNOSIS — Z23 Encounter for immunization: Secondary | ICD-10-CM | POA: Diagnosis not present

## 2022-02-27 DIAGNOSIS — E1065 Type 1 diabetes mellitus with hyperglycemia: Secondary | ICD-10-CM | POA: Diagnosis not present

## 2022-03-20 DIAGNOSIS — E109 Type 1 diabetes mellitus without complications: Secondary | ICD-10-CM | POA: Diagnosis not present

## 2022-07-27 ENCOUNTER — Ambulatory Visit: Admit: 2022-07-27 | Discharge: 2022-08-03 | Payer: 59 | Attending: Physician | Primary: Nurse Practitioner

## 2022-07-27 DIAGNOSIS — E1065 Type 1 diabetes mellitus with hyperglycemia: Secondary | ICD-10-CM

## 2022-07-27 MED ORDER — GLUCAGON 3 MG/ACTUATION NASAL SPRAY
3 | Freq: Once | NASAL | 3 refills | Status: AC | PRN
Start: 2022-07-27 — End: ?

## 2022-07-27 MED ORDER — INSULIN ASPART U-100  100 UNIT/ML SUBCUTANEOUS SOLUTION
100 | SUBCUTANEOUS | Status: DC
Start: 2022-07-27 — End: 2022-07-27

## 2022-07-27 MED ORDER — INSULIN ASPART U-100  100 UNIT/ML SUBCUTANEOUS SOLUTION
100 | SUBCUTANEOUS | 3 refills | 42.00000 days | Status: AC
Start: 2022-07-27 — End: 2022-12-06

## 2022-07-27 NOTE — Progress Notes (Signed)
HPI: Elizabeth Lowe is a 27 year old female here for initial visit for type 1 DM.    Diagnosed with type 1 diabetes in 2007.    She recently moved to [facility] [place]  Diabetes history:  Hospitalized at [facility]  Patient raised in [place], moved to [place], [place] in 2020 - establishing care at [facility] 03/21/2021  Good control as child. Increased to A1C around 9% in college years and has been as high as 14 range for times.      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 (ophto  05/04/22) follow up scheduled now in [facility]    Macrovascular complications of none known.    Concerns: She feels like she has been doing well with her blood glucose levels. Working at [employer] earlier before meals (15 minutes). But really does miss this goal much of the time.   Current Diabetes Medications/Regimen:  Insulin pump type Tslim with Control IQ technology + DEXCOM G7 (as of last week )     See upload of pump info    Minimum 72 hours Pump/Sensor data download and review:   Pump and CGM download was reviewed, identified issue:  see upload - has been in 70+ TIM recently  130 Elizabeth ; new insulin regimen given - see note     Does show   Was in exercise mode for at least a couple weeks straight!  Didn't know this  No sleep mode   Missed meal dosing (or late)       Hypoglycemia  Hypoglycemia with BG levels < 70 mg/dl frequency: Infrequent.  Symptomatic at BG of: 70  Severe hypoglycemia? Denies; has glucagon. -injection    Lifestyle:  Nutrition  Diet: Eats around ~40 CHO per meal usually. Eats 2-3 x daily    Doesn't normally eat bkfast  Often salad at lunch  Dinner frequently consists of grilled chicken, brown rice, vegetables     Physical activity:  Hikes    Social:  Smoking: None  Alcohol: 3-5 drinks/week  Works for [employer] (remotely now)     Pt is currently on appropriate contraception to prevent complications and undesired pregnancy.   S/p  recent tubal pregnancy -    Did discuss pregnancy and need for glucose control before pregnancy  From [place] originally    Symptoms: Denies chest pain, polydipsia, polyuria or blurry vision or tingling/numbness in extremities    DM/ Autoimmune Family History:  Family history of type 1 DM - no  Family history of other autoimmune disease - no known        Has completed Diabetes Education: Yes    A1c Trend:  HGB A1c - POC  Date Value Ref Range Status  02/27/2022 7.0 (H) 4.2 - 5.8 % Final      Allergies  Allergen Reactions   Codeine Anaphylaxis   Levaquin [Levofloxacin] Nausea   Penicillin V Hives    Past Medical History:  Diagnosis Date   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  Family History  Problem Relation Age of Onset   Migraine Headache Mother   Headaches Mother  Migraines   Cancer Paternal grandmother   Glaucoma No Family History   Macular Degeneration No Family History    Social History    Socioeconomic History   Marital status: Single  Tobacco Use   Smoking status: Never   Smokeless tobacco: Never  Vaping Use   Vaping Use: never used  Substance and Sexual Activity   Alcohol use: Yes  Alcohol/week: 3.0 standard drinks of alcohol  Types: 3 Cans of beer per week  Comment: occasionally   Drug use: Never   Sexual activity: Yes  Partners: Male  Birth control/protection: IUD (hormonal) in past  Albumin/Creatinine Ratio  Date Value Ref Range Status  08/19/2021 4 0 - 30 mg/g Final    Assessment/Plan:    ICD-10-CM  1. Type 1 diabetes mellitus with hyperglycemia (HCC) E10.65  2. Need for vaccination Z23      * The most  recent A1C was  HGB A1c - POC  Date Value Ref Range Status  02/27/2022 7.0 (H) 4.2 - 5.8 % Final      Discussed getting insulin bolus before meals      Continue efforts to bolus before meals/snacks.  Discussed strategies to reduce risk of lows with exercise. Reviewed "Activity" setting on pump, can consider turning on 30-60 minutes in advance of physical activity.    Sleep mode at night      Follow up in 6 weeks or so  - -can be remote  Then changes as needed      I spent a total of 65 minutes on this patient's care on the day of their visit excluding time spent related to any billed procedures. This time includes time spent with the patient as well as time spent documenting in the medical record, reviewing patient's records and tests, obtaining history, placing orders, communicating with other healthcare professionals, counseling the patient, family, or caregiver, and/or care coordination for the diagnoses above.  Cgm upload and review and interpretation in addition to above time.          \

## 2022-09-21 ENCOUNTER — Ambulatory Visit: Admit: 2022-09-21 | Payer: BLUE CROSS/BLUE SHIELD | Attending: Physician | Primary: Nurse Practitioner

## 2022-09-21 DIAGNOSIS — E1065 Type 1 diabetes mellitus with hyperglycemia: Secondary | ICD-10-CM

## 2022-09-21 NOTE — Progress Notes (Signed)
HPI: Elizabeth Lowe is a 27 year old female here for follow up  visit for type 1 DM.    Diagnosed with type 1 diabetes in 2007.      Recall:   She recently moved to [facility] from  [facility].  Diabetes history:  Hospitalized at Dx  Patient raised in [place], moved to [place], [place] in 2020 - establishing care at [facility] 03/21/2021  Good control as child. Increased to A1C around 9% in college years and has been as high as 14 range for times.      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 (ophto  05/04/22) follow up scheduled now in [facility]    Macrovascular complications of none known.    Concerns: She feels like she has been doing well with her blood glucose levels. Working at [employer] earlier before meals (15 minutes). But really does miss this goal much of the time.   Current Diabetes Medications/Regimen:  Insulin pump type Tslim with Control IQ technology + DEXCOM G7 (as of last week )     See upload of pump info    Minimum 72 hours Pump/Sensor data download and review:   Pump and CGM download was reviewed, identified issue:  see upload - has been in 70+ TIM recently  130 Elizabeth ; new insulin regimen given - see note     At last visit     Was in exercise mode for at least a couple weeks straight!  Didn't know this - this fixed  Still:   No sleep mode  still    Missed meal dosing (or late)   much of th etime      Hypoglycemia  Hypoglycemia with BG levels < 70 mg/dl frequency: Infrequent.  Symptomatic at BG of: 70  Severe hypoglycemia? Denies; has glucagon. -injection    Lifestyle:  Nutrition  Diet: Eats around ~40 CHO per meal usually. Eats 2-3 x daily    Doesn't normally eat bkfast  Often salad at lunch  Dinner frequently consists of grilled chicken, brown rice, vegetables     Physical activity:  Hikes    Social:  Smoking: None  Alcohol: 3-5 drinks/week  Works for [employer] (remotely now)     Pt is currently on appropriate  contraception to prevent complications and undesired pregnancy.   S/p recent tubal pregnancy -    Did discuss pregnancy and need for glucose control before pregnancy  From [place] originally    Symptoms: Denies chest pain, polydipsia, polyuria or blurry vision or tingling/numbness in extremities    DM/ Autoimmune Family History:  Family history of type 1 DM - no  Family history of other autoimmune disease - no known        Has completed Diabetes Education: Yes    A1c Trend:  HGB A1c - POC  Date Value Ref Range Status  02/27/2022 7.0 (H) 4.2 - 5.8 % Final      Allergies  Allergen Reactions   Codeine Anaphylaxis   Levaquin [Levofloxacin] Nausea   Penicillin V Hives    Past Medical History:  Diagnosis Date   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  Family History  Problem Relation Age of Onset   Migraine Headache Mother   Headaches Mother  Migraines   Cancer Paternal grandmother   Glaucoma No Family History   Macular Degeneration No Family History    Assessment/Plan:  Type 1` dm  -still some work to do    Discussed getting insulin bolus before meals    Setting up with support to get connected online   And to work on the frequent disconnects from [employer]    Continue efforts to bolus before meals/snacks.  Discussed strategies to reduce risk of lows with exercise. Reviewed "Activity" setting on pump, can consider turning on 30-60 minutes in advance of physical activity.    Sleep mode at night      Follow up in 6 weeks or so  - -can be remote  Then changes as needed      I spent a total of 30 minutes on this patient's care on the  day of their visit excluding time spent related to any billed procedures. This time includes time spent with the patient as well as time spent documenting in the medical record, reviewing patient's records and tests, obtaining history, placing orders, communicating with other healthcare professionals, counseling the patient, family, or caregiver, and/or care coordination for the diagnoses above.  Cgm upload and review and interpretation in addition to above time.     Cgm upload and review and interpretation in addition to above time.              \

## 2022-12-06 MED ORDER — INSULIN ASPART U-100  100 UNIT/ML SUBCUTANEOUS SOLUTION
100 | SUBCUTANEOUS | 3 refills | 42.00000 days | Status: DC
Start: 2022-12-06 — End: 2023-08-27

## 2022-12-07 MED ORDER — DEXCOM G7 SENSOR DEVICE
3 refills | Status: DC
Start: 2022-12-07 — End: 2023-08-27

## 2022-12-07 NOTE — Telephone Encounter (Signed)
From: Rosann Auerbach  To: Weyman Pedro  Sent: 12/06/2022 1:17 PM PDT  Subject: Medication Renewal Request    Refills have been requested for the following medications:   Other - Dexcom G7 sensors (I want to compare my price benefits between [facility] and [employer])    Preferred pharmacy: [facility] [id number] - [address] [place], [place] - [address] AT [address]  Delivery method: Pickup      Medication renewals requested in this message routed separately:     insulin aspart U-100 (NOVOLOG) 100 unit/mL injection [Abdulah Iqbal]   Patient Comment: [facility] had [facility] when [facility] was having trouble getting insulin, and the pharmacist said it would be best to have it at both places in case

## 2022-12-07 NOTE — Telephone Encounter (Signed)
Hi Team    Since Con Memos is out today.    Can you guys help with Ashely       DME paper from [facility]        Under DME file?    She is out of supply.    Thanks so much!    Candise Bowens

## 2023-01-23 ENCOUNTER — Ambulatory Visit: Admit: 2023-01-23 | Payer: BLUE CROSS/BLUE SHIELD | Attending: Physician | Primary: Nurse Practitioner

## 2023-01-23 DIAGNOSIS — E1065 Type 1 diabetes mellitus with hyperglycemia: Secondary | ICD-10-CM

## 2023-01-23 NOTE — Progress Notes (Signed)
HPI: Elizabeth Lowe) Tiner is a 27 year old female here for follow up  visit for type 1 DM.    Diagnosed with type 1 diabetes in 2007.      Recall:   She recently moved to [facility] from  [facility].  Diabetes history:  Hospitalized at Dx  Patient raised in [place], moved to [place], [place] in 2020 - establishing care at [facility] 03/21/2021  Good control as child. Increased to A1C around 9% in college years and has been as high as 14 range for times.      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 (ophto  05/04/22) follow up scheduled now in [facility]    Macrovascular complications of none known.    Concerns: She feels like she has been doing well with her blood glucose levels. Working at [employer] earlier before meals (15 minutes). But really does miss this goal much of the time.   Current Diabetes Medications/Regimen:  Insulin pump type Tslim with Control IQ technology + DEXCOM G7 (as of last week )     See upload of pump info    Minimum 72 hours Pump/Sensor data download and review:   Pump and CGM download was reviewed, identified issue:  see upload - has been in 70+ TIM recently  130 Elizabeth ; new insulin regimen given - see note     At last visit     Was in exercise mode for at least a couple weeks straight!  Didn't know this - this fixed  Still:   No sleep mode  still    Missed meal dosing (or late)   much of th etime      NOW:        Issue:  daily many lost connections  Then off automode and gets low    Hypoglycemia  Hypoglycemia with BG levels < 70 mg/dl frequency: Infrequent.  Symptomatic at BG of: 70  Severe hypoglycemia? Denies; has glucagon. -injection    Lifestyle:  Nutrition  Diet: Eats around ~40 CHO per meal usually. Eats 2-3 x daily    Doesn't normally eat bkfast  Often salad at lunch  Dinner frequently consists of grilled chicken, brown rice, vegetables     Physical activity:  Hikes    Social:  Smoking: None  Alcohol: 3-5  drinks/week  Works for [employer] (remotely now)     Pt is currently on appropriate contraception to prevent complications and undesired pregnancy.   S/p recent tubal pregnancy -    Did discuss pregnancy and need for glucose control before pregnancy  From [place] originally    Symptoms: Denies chest pain, polydipsia, polyuria or blurry vision or tingling/numbness in extremities    DM/ Autoimmune Family History:  Family history of type 1 DM - no  Family history of other autoimmune disease - no known        Has completed Diabetes Education: Yes    A1c Trend:  HGB A1c - POC  Date Value Ref Range Status  02/27/2022 7.0 (H) 4.2 - 5.8 % Final    Allergies  Allergen Reactions   Codeine Anaphylaxis   Levaquin [Levofloxacin] Nausea   Penicillin V Hives    Past Medical History:  Diagnosis Date   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  Family History  Problem Relation Age of Onset   Migraine Headache Mother   Headaches Mother  Migraines   Cancer Paternal grandmother   Glaucoma No Family History   Macular Degeneration No Family History    Assessment/Plan:  Type 1` dm  -still some work to do  Decrease all basal by 0.2  Contact made with trainer to help figure out the connection issues  Maybe move to [place]?    Discussed getting insulin bolus before meals  Discussed using exercise mode again      Continue efforts to bolus before meals/snacks.  Again Discussed strategies to reduce risk of lows with exercise. Reviewed "Activity" setting on pump, can consider turning on 30-60 minutes in advance of physical activity.    Sleep mode at  night - using this now      Decrease all basal doses by 0.2/hour    Labs ordered  Contact made to figure out connection issues.     ?? Maybe move to libre 2+              I spent a total of 40 minutes on this patient's care on the day of their visit excluding time spent related to any billed procedures. This time includes time spent with the patient as well as time spent documenting in the medical record, reviewing patient's records and tests, obtaining history, placing orders, communicating with other healthcare professionals, counseling the patient, family, or caregiver, and/or care coordination for the diagnoses above.  Cgm upload and review and interpretation in addition to above time.                \

## 2023-05-31 NOTE — Telephone Encounter (Signed)
[employer] [place] has not yet replied to your PA request. You may close this dialog, return to your dashboard, and perform other tasks.  To check for an update later, open this request again from your dashboard.  If [employer] [place] has not replied to your request within 24-72 hours please contact [employer] [place] at [phone].

## 2023-08-08 NOTE — Telephone Encounter (Signed)
 Hi Reena,    Received a call from her insurance     Does she need pa for her dex.com g7 Sensor?    Thanks  Jen

## 2023-08-27 ENCOUNTER — Telehealth
Admit: 2023-08-27 | Discharge: 2023-08-27 | Payer: BLUE CROSS/BLUE SHIELD | Attending: Physician | Primary: Nurse Practitioner

## 2023-08-27 DIAGNOSIS — E1065 Type 1 diabetes mellitus with hyperglycemia: Secondary | ICD-10-CM

## 2023-08-27 MED ORDER — INSULIN ASPART U-100  100 UNIT/ML SUBCUTANEOUS SOLUTION
100 | SUBCUTANEOUS | 3 refills | 53.00000 days | Status: AC
Start: 2023-08-27 — End: ?

## 2023-08-27 MED ORDER — DEXCOM G7 SENSOR DEVICE
3 refills | Status: DC
Start: 2023-08-27 — End: 2024-01-07

## 2023-08-27 NOTE — H&P (Signed)
 HPI: Elizabeth Lowe) Ludwig is a 28 year old female here for follow up  visit for type 1 DM.    Diagnosed with type 1 diabetes in 2007.    Recall:   She recently moved to [facility] from  [facility].  Diabetes history:  Hospitalized at Dx  Patient raised in [place] , moved to [facility], [place] in 2020 - establishing care at [facility] 03/21/2021  Good control as child. Increased to A1C around 9% in college years and has been as high as 14 range for times.      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 (ophto  05/04/22) follow up scheduled now in [facility]    Macrovascular complications of none known.    Concerns: She feels like she has been doing well with her blood glucose levels. Working at [employer] earlier before meals (15 minutes). But really does miss this goal much of the time.   Current Diabetes Medications/Regimen:  Insulin  pump type Tslim with Control IQ technology + DEXCOM G7 (as of last week )     See upload of pump info    Minimum 72 hours Pump/Sensor data download and review:   Pump and CGM download was reviewed, identified issue:  no data uploaded     At last visit     Was in exercise mode for at least a couple weeks straight!  Didn't know this - this fixed  Still:   No sleep mode  still    Missed meal dosing (or late)   much of th etime      NOW:  None uploaded      Issue:  daily many lost connections  Then off automode and gets low  A bit better at this point - but still issue    Hypoglycemia  Hypoglycemia with BG levels < 70 mg/dl frequency: Infrequent.  Symptomatic at BG of: 70  Severe hypoglycemia? Denies; has glucagon . -injection    Lifestyle:  Nutrition  Diet: Eats around ~40 CHO per meal usually. Eats 2-3 x daily    Doesn't normally eat bkfast  Often salad at lunch  Dinner frequently consists of grilled chicken, brown rice, vegetables     Physical activity:  Hikes    Social:  Smoking: None  Alcohol: 3-5 drinks/week  Works for  [employer] (remotely now)     Pt is currently on appropriate contraception to prevent complications and undesired pregnancy.   S/p recent tubal pregnancy -    Did discuss pregnancy and need for glucose control before pregnancy  From [place]  originally    Symptoms: Denies chest pain, polydipsia, polyuria or blurry vision or tingling/numbness in extremities    DM/ Autoimmune Family History:  Family history of type 1 DM - no  Family history of other autoimmune disease - no known    Has completed Diabetes Education: Yes    A1c Trend:  HGB A1c - POC  Date Value Ref Range Status  02/27/2022 7.0 (H) 4.2 - 5.8 % Final    Allergies  Allergen Reactions   Codeine Anaphylaxis   Levaquin [Levofloxacin] Nausea   Penicillin V Hives    Past Medical History:  Diagnosis Date   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  Family History  Problem Relation Age of Onset   Migraine Headache Mother   Headaches Mother  Migraines   Cancer Paternal grandmother   Glaucoma No Family History   Macular Degeneration No Family History    Assessment/Plan:  Type 1` dm  -still some work to do  Need the data available to look at  Need up to date labs    Discussed getting insulin  bolus before meals  Discussed using exercise mode again        Sleep mode at night - using this now    Contact with support to help get her data uploaded and issues with the cgm  Will look at all the tests and then make adjustments    I spent a total of 30 minutes on this patient's care on the day of their visit excluding time spent related to any billed procedures. This time  includes time spent with the patient as well as time spent documenting in the medical record, reviewing patient's records and tests, obtaining history, placing orders, communicating with other healthcare professionals, counseling the patient, family, or caregiver, and/or care coordination for the diagnoses above             \

## 2023-08-27 NOTE — Progress Notes (Signed)
 Paperwork faxed

## 2023-08-30 NOTE — Telephone Encounter (Signed)
 Paperwork faxed

## 2023-08-30 NOTE — Telephone Encounter (Signed)
-----   Message from Robb Child sent at 08/27/2023  9:15 AM PDT -----  Needs the paperwork sent over to [employer] - - for all her pump supplies for her tslim      thanks.

## 2024-01-07 MED ORDER — DEXCOM G7 SENSOR DEVICE
3 refills | Status: DC
Start: 2024-01-07 — End: 2024-03-31

## 2024-03-28 NOTE — Telephone Encounter (Signed)
 Patient/Caregiver calling for the following reason: patient scheduled a FUP appointment with Dr. Flo but they will need something sooner than 04/30/2024 as their insulin  pump will be out of warranty on 04/12/2024. Is wondering if Dr. Flo has anything sooner or if they can see a NP working with Dr. Flo. Please assist, thank you!    Preferred mode of communication is MyChart.  Preferred phone number:  [phone]    Genna to leave message Yes.    Elizabeth Lowe  [facility]

## 2024-03-31 ENCOUNTER — Telehealth
Admit: 2024-03-31 | Discharge: 2024-03-31 | Payer: BLUE CROSS/BLUE SHIELD | Attending: Physician | Primary: Nurse Practitioner

## 2024-03-31 DIAGNOSIS — E1065 Type 1 diabetes mellitus with hyperglycemia: Secondary | ICD-10-CM

## 2024-03-31 MED ORDER — DEXCOM G7 15 DAY SENSOR DEVICE
3 refills | Status: AC
Start: 2024-03-31 — End: ?

## 2024-03-31 NOTE — H&P (Signed)
 HPI: Elizabeth Lowe is a 28 year old female here for follow up  visit for type 1 DM.    I performed this evaluation using real-time telehealth tools, including a live video Zoom connection between my location and the patient's location. Prior to initiating, the patient consented to perform this evaluation using telehealth tools.    Diagnosed with type 1 diabetes in 2007.    Recall:   She recently moved to [facility] from  [facility].  Diabetes history:  Hospitalized [facility]  Patient raised in [place] , moved to [facility], [place] in 2020 - establishing care at [facility] 03/21/2021  Good control as child. Increased to A1C around 9% in college years and has been as high as 14 range for times.      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 (ophto  05/04/22) follow up scheduled now in [facility]    Macrovascular complications of none known.    Concerns: She feels like she has been doing well with her blood glucose levels. Working at [employer] earlier before meals (15 minutes). But really does miss this goal much of the time.   Current Diabetes Medications/Regimen:  Insulin  pump type Tslim with Control IQ technology + DEXCOM G7    See upload of pump info                Minimum 72 hours Pump/Sensor data download and review:   Pump and CGM download was reviewed, identified issue:       Significant issues with disconnects (did touch base with support but still issue)  Issues with insulin  after meal          Missed meal dosing (or late)   much of th etime in past    Issue:  daily many lost connections  Then off automode and gets low  A bit better at this point - but still issue    Hypoglycemia  Hypoglycemia with BG levels < 70 mg/dl frequency: Infrequent.  Symptomatic at BG of: 70  Severe hypoglycemia? Denies; has glucagon . -injection    Lifestyle:  Nutrition  Diet: Eats around ~40 CHO per meal usually. Eats 2-3 x  daily    Doesn't normally eat bkfast  Often salad at lunch  Dinner frequently consists of grilled chicken, brown rice, vegetables     Physical activity:  Hikes    Social:  Smoking: None  Alcohol: 3-5 drinks/week  Works for [employer] (remotely now)     Pt is currently on appropriate contraception to prevent complications and undesired pregnancy.   S/p recent tubal pregnancy -    Did discuss pregnancy and need for glucose control before pregnancy  From [place]  originally    Symptoms: Denies chest pain, polydipsia, polyuria or blurry vision or tingling/numbness in extremities    DM/ Autoimmune Family History:  Family history of type 1 DM - no  Family history of other autoimmune disease - no known    Has completed Diabetes Education: Yes    A1c Trend:  HGB A1c - POC  Date Value Ref Range Status  02/27/2022 7.0 (H) 4.2 - 5.8 % Final    Allergies  Allergen Reactions   Codeine Anaphylaxis   Levaquin [Levofloxacin] Nausea   Penicillin V Hives    Past Medical History:  Diagnosis Date   Eye disorder due  to diabetes [facility]) June 2023  Opthamologist in [place], [place] noted moderate proliferative retinopathy, last A1C (taken May 2023) was 7.2)   Headache January 2022  Had first migraine in Winter 2022, lost vision in one eye   Recurrent UTI March 2021   Type 1 diabetes mellitus (HCC)   Vision abnormalities 2012  Have corrective lenses and contacts, get eyes checked yearly for retinopathy    No past surgical history on file.  Family History  Problem Relation Age of Onset   Migraine Headache Mother   Headaches Mother  Migraines   Cancer Paternal grandmother   Glaucoma No Family History   Macular Degeneration No Family History    Assessment/Plan:  Type 1` dm  -still some work to do again  Will need new pump as out of warranty    Paperwork going for new pump  Move to dexcom 15 day    I spent a total of 40 minutes on this patient's care on the day of their visit excluding time spent related to any billed procedures. This time  includes time spent with the patient as well as time spent documenting in the medical record, reviewing patient's records and tests, obtaining history, placing orders, communicating with other healthcare professionals, counseling the patient, family, or caregiver, and/or care coordination for the diagnoses above    Cgm upload and review and interpretation in addition to above time.                \

## 2024-04-03 ENCOUNTER — Ambulatory Visit: Admit: 2024-04-03 | Payer: BLUE CROSS/BLUE SHIELD | Primary: Nurse Practitioner

## 2024-04-03 DIAGNOSIS — E1065 Type 1 diabetes mellitus with hyperglycemia: Secondary | ICD-10-CM

## 2024-04-03 LAB — LIPID PANEL
Cholesterol, HDL, Plasma/Serum: 61 mg/dL (ref >39)
Cholesterol, Total, Plasma/Serum: 176 mg/dL (ref <200)
Cholesterol/HDL Ratio: 2.9 (ref <5.6)
LDL Cholesterol: 109 mg/dL (ref <130)
Non HDL Cholesterol: 115 mg/dL (ref <160)
Triglycerides, Plasma / Serum: 30 mg/dL (ref <200)

## 2024-04-03 LAB — COMPREHENSIVE METABOLIC PANEL
Alanine Transaminase, Plasma / Serum: 12 U/L (ref 10–61)
Albumin, Plasma / Serum: 4 g/dL (ref 3.5–5.0)
Alkaline Phosphatase, Plasma / Serum: 55 U/L (ref 38–108)
Anion Gap: 9 mmol/L (ref 4–14)
Aspartate Transaminase, Plasma / Serum: 20 U/L (ref 5–44)
Bilirubin, Total, Plasma/Serum: 0.6 mg/dL (ref 0.2–1.2)
Calcium, Total, Plasma / Serum: 8.8 mg/dL (ref 8.4–10.5)
Carbon Dioxide, Total, Plasma / Serum: 23 mmol/L (ref 22–29)
Chloride, Plasma / Serum: 106 mmol/L (ref 101–110)
Creatinine, Plasma / Serum: 0.65 mg/dL (ref 0.55–1.02)
Glucose, Non-Fasting, Plasma/Serum: 152 mg/dL (ref 70–199)
Potassium, Plasma/Serum: 4 mmol/L (ref 3.5–5.0)
Protein, Total, Plasma / Serum: 7.6 g/dL (ref 6.3–8.6)
Sodium, Plasma/Serum: 138 mmol/L (ref 135–145)
Urea Nitrogen, Serum/Plasma: 11 mg/dL (ref 7–25)
eGFR: 124 mL/min/1.73 sq m (ref >59)

## 2024-04-03 LAB — CBC W/AUTO DIFF (LAB ONLY)
Basophils Absolute: 0.04 10*9/L (ref 0.00–0.10)
Eosinophils Absolute: 0.52 10*9/L — ABNORMAL HIGH (ref 0.00–0.40)
Hematocrit: 40.4 % (ref 36.0–46.0)
Hemoglobin: 13.7 g/dL (ref 12.0–15.5)
Immature Granulocytes Absolute: 0.01 10*9/L (ref <0.10)
Lymphocytes Absolute: 2.36 10*9/L (ref 1.00–3.40)
MCH: 29.6 pg (ref 26.0–34.0)
MCHC: 33.9 g/dL (ref 31.0–36.0)
MCV: 87 fL (ref 80–100)
Mean Platelet Volume (MPV): 9.6 fL (ref 9.1–12.6)
Monocytes Absolute: 0.48 10*9/L (ref 0.20–0.80)
Neutrophils Absolute: 3.35 10*9/L (ref 1.80–6.80)
Nucleated RBCs: 0 % (ref <=0.0)
Platelet Count: 302 10*9/L (ref 140–450)
RBC: 4.63 10*12/L (ref 4.00–5.20)
RDWCV: 11.9 % (ref 11.7–14.4)
WBC: 6.8 10*9/L (ref 3.4–10.0)

## 2024-04-03 LAB — ALBUMIN-CREATININE RATIO, URINE, RANDOM
Creatinine, Random Urine: 241 mg/dL
Urine Albumin / Creatinine Ratio: 5 mg/g/Creatinine (ref <30)

## 2024-04-04 LAB — FREE T4: Free T4: 13 pmol/L (ref 10–18)

## 2024-04-04 LAB — THYROID STIMULATING HORMONE: Thyroid Stimulating Hormone, Plasma/Serum: 1.57 m[IU]/L (ref 0.45–4.12)

## 2024-04-04 LAB — HEMOGLOBIN A1C: Hemoglobin A1c: 6.7 % — ABNORMAL HIGH (ref 4.3–5.6)

## 2024-05-10 MED ORDER — DEXCOM G7 SENSOR DEVICE
3 refills | Status: AC
Start: 2024-05-10 — End: ?
